# Patient Record
Sex: Male | Born: 1975 | Race: White | Hispanic: No | Smoking: Former smoker
Health system: Southern US, Community
[De-identification: ages and names within clinical notes are randomized; demographics above are authoritative.]

## PROBLEM LIST (undated history)

## (undated) DIAGNOSIS — K219 Gastro-esophageal reflux disease without esophagitis: Secondary | ICD-10-CM

## (undated) DIAGNOSIS — G4730 Sleep apnea, unspecified: Secondary | ICD-10-CM

## (undated) HISTORY — PX: OTHER SURGICAL HISTORY: SHX169

## (undated) HISTORY — DX: Gastro-esophageal reflux disease without esophagitis: K21.9

## (undated) HISTORY — PX: HERNIA REPAIR: SHX51

## (undated) HISTORY — PX: PENILE CYST REMOVAL: SHX2199

## (undated) HISTORY — DX: Sleep apnea, unspecified: G47.30

## (undated) HISTORY — PX: WISDOM TOOTH EXTRACTION: SHX21

---

## 2017-09-17 ENCOUNTER — Ambulatory Visit
Admission: RE | Admit: 2017-09-17 | Discharge: 2017-09-17 | Disposition: A | Discharge status: Home / Self Care | Payer: Self-pay | Source: Ambulatory Visit | Attending: Family Medicine | Admitting: Family Medicine

## 2017-09-17 ENCOUNTER — Other Ambulatory Visit: Payer: Self-pay | Admitting: Family Medicine

## 2017-09-17 DIAGNOSIS — N5089 Other specified disorders of the male genital organs: Secondary | ICD-10-CM

## 2017-09-18 ENCOUNTER — Other Ambulatory Visit: Payer: Self-pay

## 2019-05-11 IMAGING — US US SCROTUM W/ DOPPLER COMPLETE
1 series · 13 of 25 positions shown · non-contrast
Comparison: None.

CLINICAL DATA: 41-year-old male with right side palpable mass
discovered 2 weeks ago, slightly painful. No known injury. No prior
surgery.

EXAM:
SCROTAL ULTRASOUND
DOPPLER ULTRASOUND OF THE TESTICLES
TECHNIQUE: Complete ultrasound examination of the testicles, epididymis, and
other scrotal structures was performed. Color and spectral Doppler
ultrasound were also utilized to evaluate blood flow to the
testicles.

[Series 1: us scrotum w/ doppler complete · 0.08mm/px · 13 of 50 slices shown]
[im 1/50]
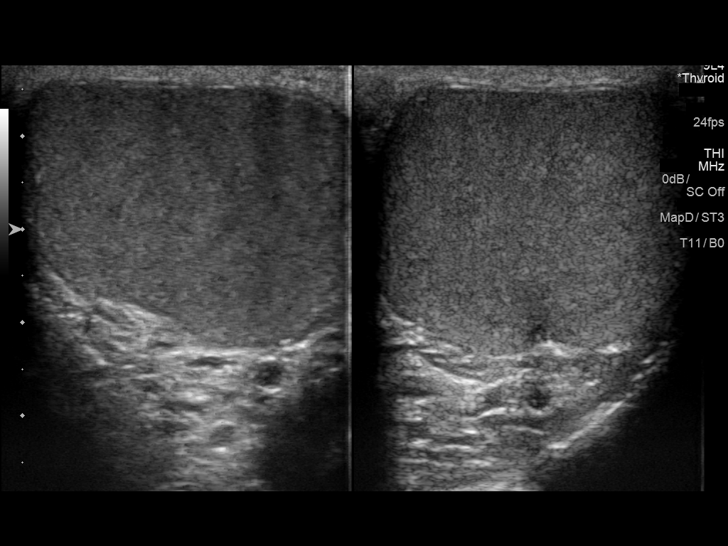
[im 5/50]
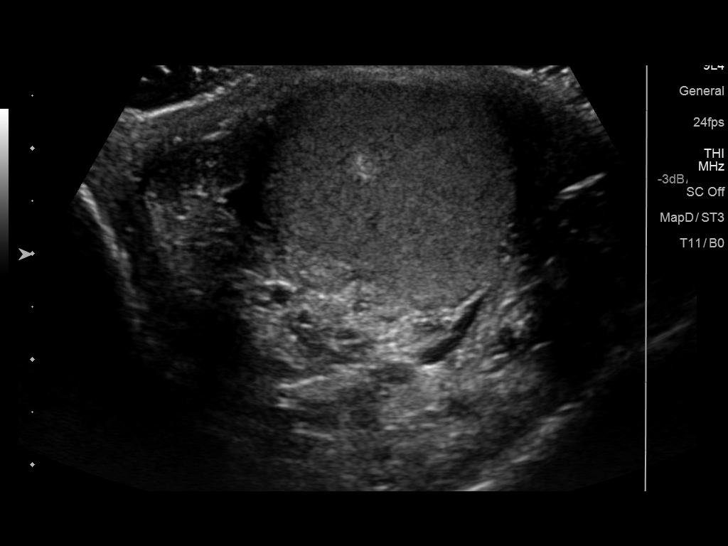
[im 9/50]
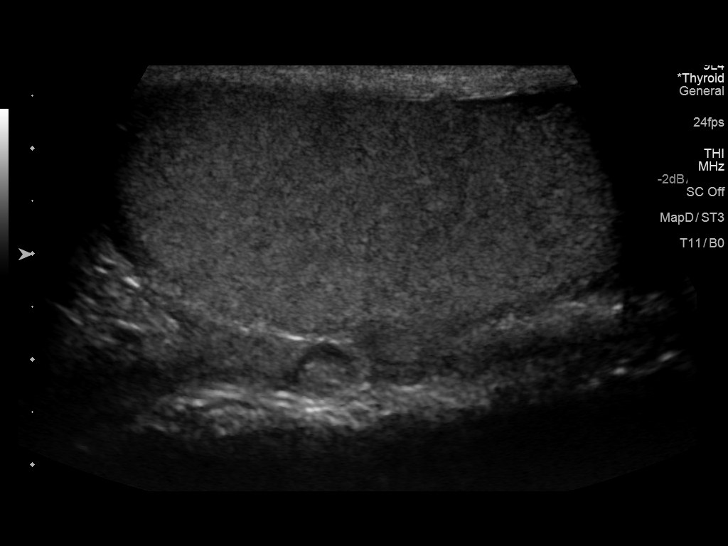
[im 13/50]
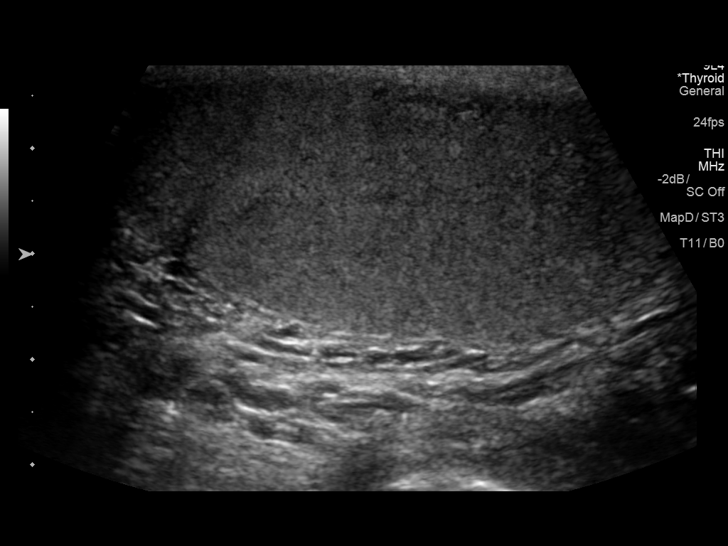
[im 17/50]
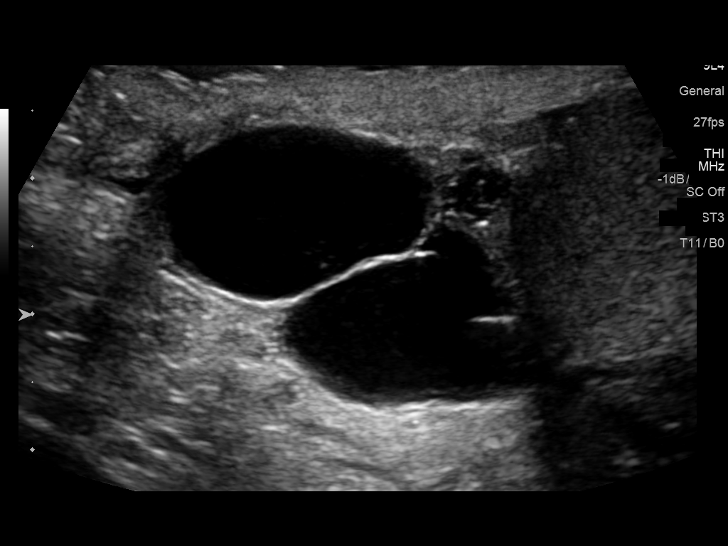
[im 21/50]
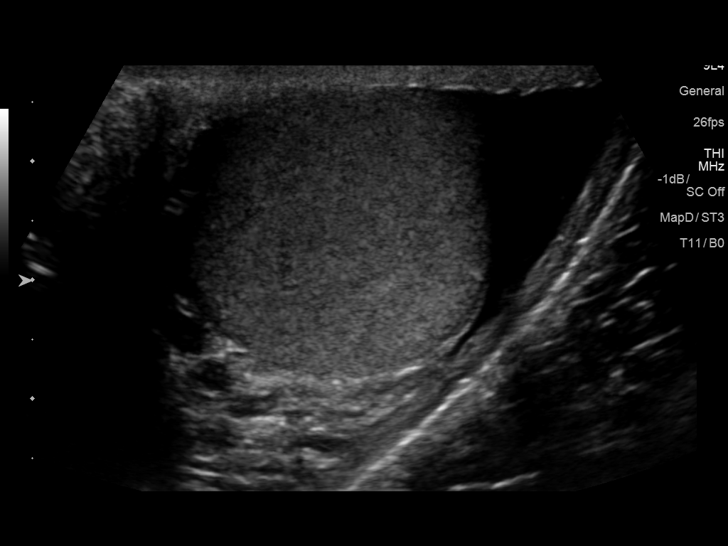
[im 25/50]
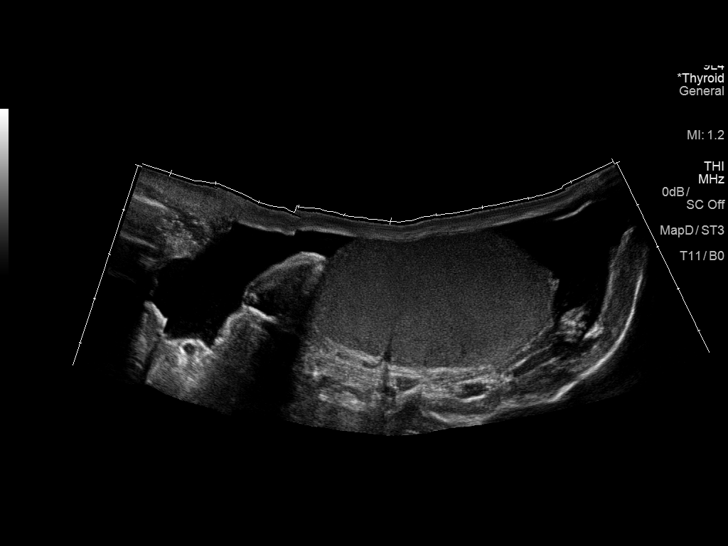
[im 29/50]
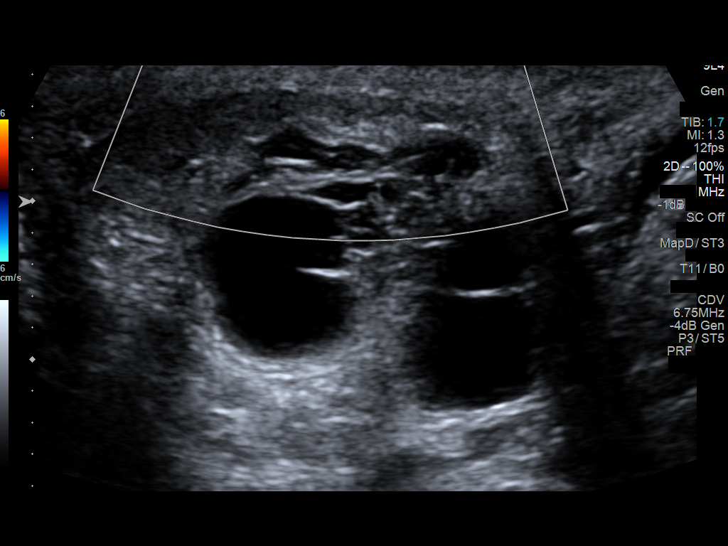
[im 33/50]
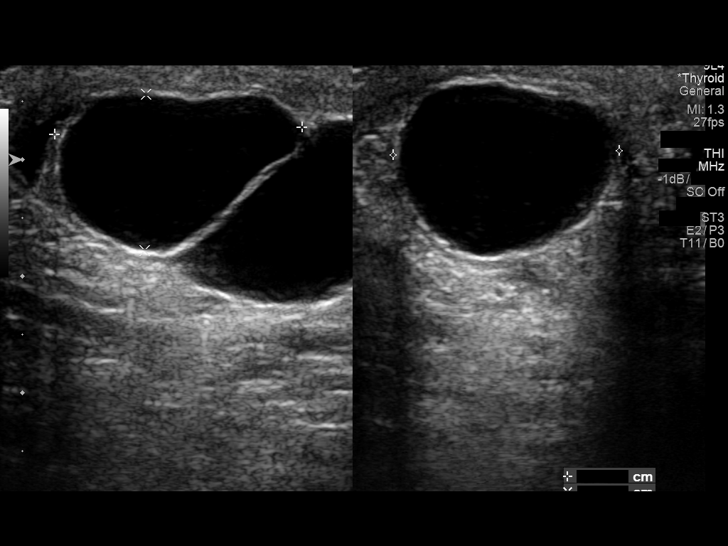
[im 37/50]
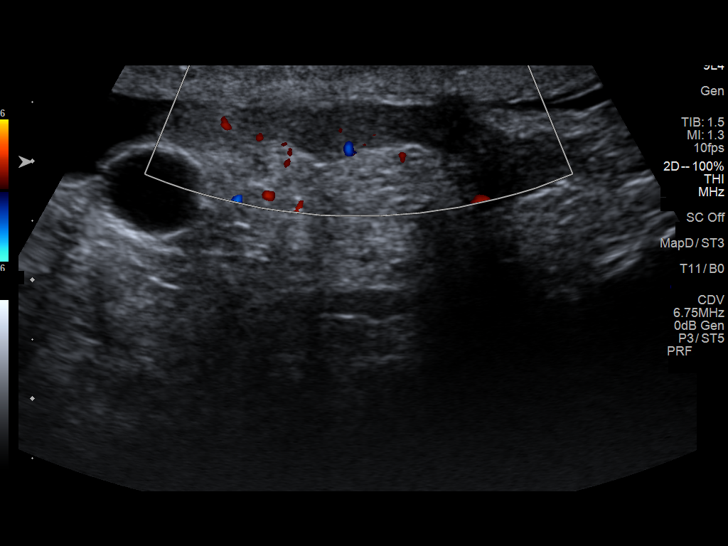
[im 41/50]
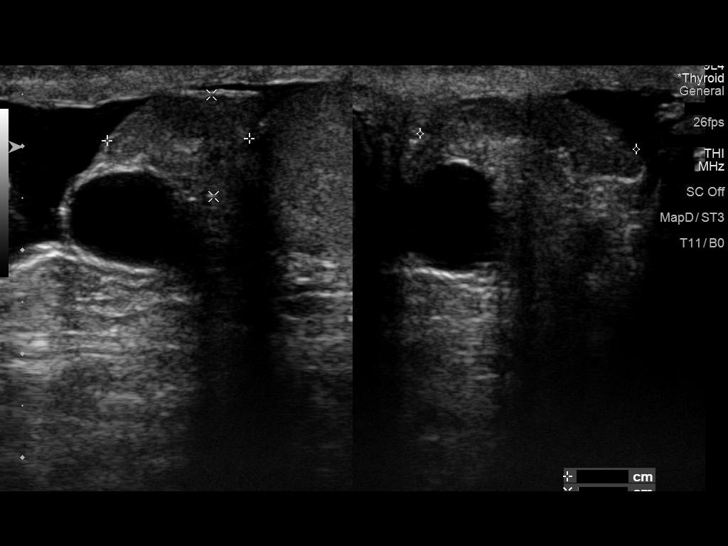
[im 45/50]
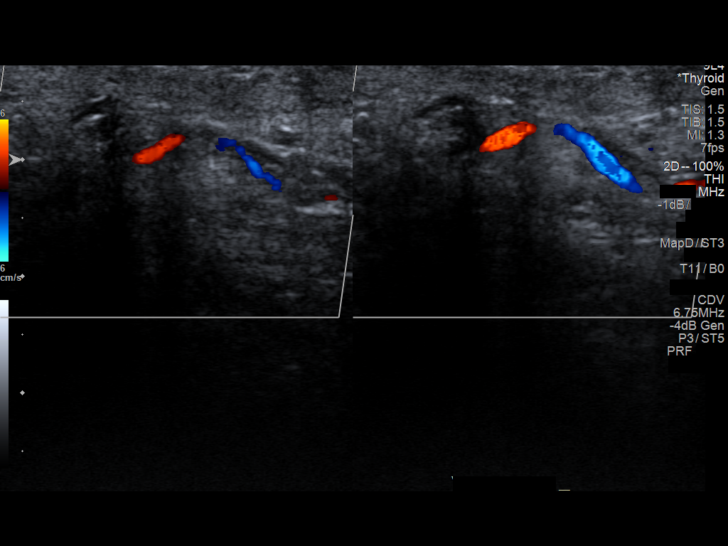
[im 50/50]
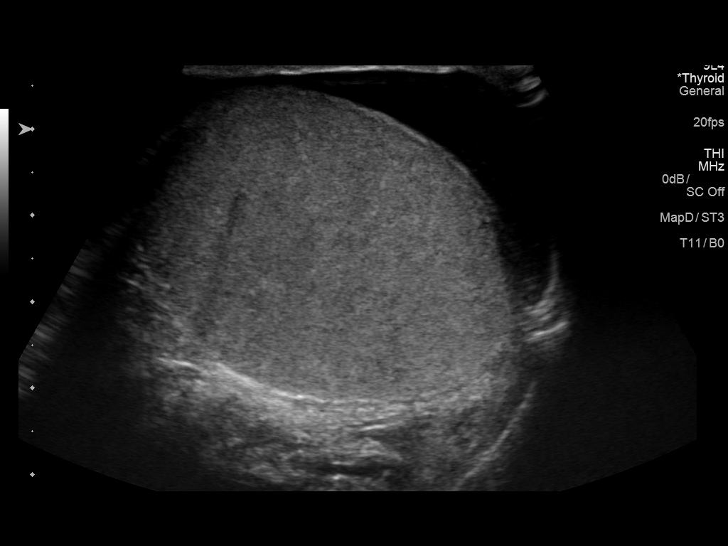

[13 of 25 positions shown; findings below may reference images not displayed]

FINDINGS: Right testicle

Measurements: 5.6 x 2.6 x 3.4 centimeters. No mass or microlithiasis
visualized.

Left testicle

Measurements: 5.1 x 3.0 x 3.4 centimeters. No mass or microlithiasis
visualized.

Right epididymis: Prominent but simple appearing cystic areas in the
head of the right epididymis measure up to 2.6 centimeters
individually with no associated vascularity (image 34) and appear to
correspond to the palpable abnormality (image 23). The right
epididymis otherwise appears normal.

Left epididymis: Normal aside from smaller 1.8 centimeter left
epididymal head cyst (image 46).

Hydrocele: Simple appearing left hydrocele (image 31). Negative for
right side hydrocele.

Varicocele:  None visualized.

Pulsed Doppler interrogation of both testes demonstrates normal low
resistance arterial and venous waveforms bilaterally.
IMPRESSION: 1. Negative for testicular mass or torsion. Benign appearing right
epididymal head cysts appear to correspond to the palpable
abnormality.
2. Small to moderate size left hydrocele with simple fluid
composition. This is probably postinflammatory.

## 2021-11-08 ENCOUNTER — Other Ambulatory Visit: Payer: Self-pay

## 2021-11-08 ENCOUNTER — Other Ambulatory Visit: Payer: Self-pay | Admitting: Dermatology

## 2021-11-08 ENCOUNTER — Ambulatory Visit (INDEPENDENT_AMBULATORY_CARE_PROVIDER_SITE_OTHER): Payer: No Typology Code available for payment source | Admitting: Dermatology

## 2021-11-08 DIAGNOSIS — L820 Inflamed seborrheic keratosis: Secondary | ICD-10-CM | POA: Diagnosis not present

## 2021-11-08 DIAGNOSIS — L918 Other hypertrophic disorders of the skin: Secondary | ICD-10-CM | POA: Diagnosis not present

## 2021-11-08 DIAGNOSIS — D1801 Hemangioma of skin and subcutaneous tissue: Secondary | ICD-10-CM

## 2021-11-08 DIAGNOSIS — D229 Melanocytic nevi, unspecified: Secondary | ICD-10-CM | POA: Diagnosis not present

## 2021-11-08 DIAGNOSIS — B360 Pityriasis versicolor: Secondary | ICD-10-CM | POA: Diagnosis not present

## 2021-11-08 DIAGNOSIS — Z1283 Encounter for screening for malignant neoplasm of skin: Secondary | ICD-10-CM | POA: Diagnosis not present

## 2021-11-08 DIAGNOSIS — D485 Neoplasm of uncertain behavior of skin: Secondary | ICD-10-CM

## 2021-11-08 LAB — POCT SKIN KOH: Skin KOH, POC: POSITIVE — AB

## 2021-11-08 MED ORDER — FLUCONAZOLE 100 MG PO TABS: 100.0000 mg | ORAL_TABLET | Freq: Every day | ORAL | 0 refills | Status: AC

## 2021-11-08 NOTE — Patient Instructions (Addendum)
Ketoconazole 1% shampoo - Nizoral ? ?Biopsy, Surgery (Curettage) & Surgery (Excision) Aftercare Instructions ? ?1. Okay to remove bandage in 24 hours ? ?2. Wash area with soap and water ? ?3. Apply Vaseline to area twice daily until healed (Not Neosporin) ? ?4. Okay to cover with a Band-Aid to decrease the chance of infection or prevent irritation from clothing; also it's okay to uncover lesion at home. ? ?5. Suture instructions: return to our office in 7-10 or 10-14 days for a nurse visit for suture removal. Variable healing with sutures, if pain or itching occurs call our office. It's okay to shower or bathe 24 hours after sutures are given. ? ?6. The following risks may occur after a biopsy, curettage or excision: bleeding, scarring, discoloration, recurrence, infection (redness, yellow drainage, pain or swelling). ? ?7. For questions, concerns and results call our office at [facility] before 4pm & Friday before 3pm. Biopsy results will be available in 1 week. ? ?

## 2021-11-21 ENCOUNTER — Encounter: Payer: Self-pay | Admitting: Dermatology

## 2021-11-21 NOTE — Progress Notes (Signed)
? ?  Follow-Up Visit ?  ?Subjective  ?Leonard White is a 46 y.o. male who presents for the following: Annual Exam (Pt her for annual. Pt has spots of concern on the inner elbow and neck, red spots on the flanks. No personal of family hx of melanoma or non melanoma skin cancer to pts knowledge ). ? ?General skin examination, rash on torso, changing growth on the lower abdomen ?Location:  ?Duration:  ?Quality:  ?Associated Signs/Symptoms: ?Modifying Factors:  ?Severity:  ?Timing: ?Context:  ? ?Objective  ?Well appearing patient in no apparent distress; mood and affect are within normal limits. ?No atypical pigmented lesions (all checked with dermoscopy).  1 possible nonmelanoma skin cancer lower abdomen will be biopsied. ? ?Mid Back, Right Abdomen (side) - Upper ?Multiple 1 mm smooth red dermal papules ? ?Left Lower Back ?4 mm, no dermoscopic atypia ? ?Right Axilla ?Pedunculated 2 mm flesh-colored papule ? ?Right Abdomen (side) - Lower ?1.2 cm keratotic nodule, the right portion fits a benign seborrheic keratosis but there is a waxy pink area on the left portion which could be an irritated Keratosis or a coincident squamous cell carcinoma. ? ? ? ? ? ? ?Left Upper Back, Mid Back, Right Upper Back ?Extensive subtle pink branny scale, KOH positive ? ? ? ?A full examination was performed including scalp, head, eyes, ears, nose, lips, neck, chest, axillae, abdomen, back, buttocks, bilateral upper extremities, bilateral lower extremities, hands, feet, fingers, toes, fingernails, and toenails. All findings within normal limits unless otherwise noted below.  Areas beneath undergarments not fully examined. ? ? ?Assessment & Plan  ? ? ?Encounter for screening for malignant neoplasm of skin ? ?Annual skin examination.  Continued ultraviolet protection. ? ?Cherry angioma (2) ?Right Abdomen (side) - Upper; Mid Back ? ?No intervention necessary ? ?Nevus ?Left Lower Back ? ?Leave if stable ? ?Skin tag ?Right Axilla ? ?Patient may  choose to have removed in future. ? ?Neoplasm of uncertain behavior of skin ?Right Abdomen (side) - Lower ? ?Skin / nail biopsy ?Type of biopsy: tangential   ?Informed consent: discussed and consent obtained   ?Timeout: patient name, date of birth, surgical site, and procedure verified   ?Anesthesia: the lesion was anesthetized in a standard fashion   ?Anesthetic:  1% lidocaine w/ epinephrine 1-100,000 local infiltration ?Instrument used: flexible razor blade   ?Hemostasis achieved with: aluminum chloride and electrodesiccation   ?Outcome: patient tolerated procedure well   ?Post-procedure details: wound care instructions given   ? ?Specimen 1 - Surgical pathology ?Differential Diagnosis: bcc vs scc - cautery only  ? ?Check Margins: No ? ?Tinea versicolor ?Left Upper Back; Right Upper Back; Mid Back ? ?Oral fluconazole, warned of rare possibility of idiosyncratic/allergic reaction involving skin or liver.  Also told not to judge blending of color for at least 3 to 6 months. ? ?POCT Skin KOH - Left Upper Back, Mid Back, Right Upper Back ? ?Related Medications ?fluconazole (DIFLUCAN) 100 MG tablet ?Take 1 tablet (100 mg total) by mouth daily. ? ? ? ? ? ?I, Lavonna Monarch, MD, have reviewed all documentation for this visit.  The documentation on 11/21/21 for the exam, diagnosis, procedures, and orders are all accurate and complete. ?

## 2021-12-08 ENCOUNTER — Encounter (HOSPITAL_BASED_OUTPATIENT_CLINIC_OR_DEPARTMENT_OTHER): Payer: Self-pay

## 2021-12-08 DIAGNOSIS — R400 Somnolence: Secondary | ICD-10-CM

## 2021-12-09 ENCOUNTER — Ambulatory Visit (HOSPITAL_BASED_OUTPATIENT_CLINIC_OR_DEPARTMENT_OTHER): Payer: No Typology Code available for payment source | Attending: Physician Assistant | Admitting: Internal Medicine

## 2021-12-09 VITALS — Ht 73.0 in | Wt 205.0 lb

## 2021-12-09 DIAGNOSIS — G4733 Obstructive sleep apnea (adult) (pediatric): Secondary | ICD-10-CM | POA: Insufficient documentation

## 2021-12-09 DIAGNOSIS — R400 Somnolence: Secondary | ICD-10-CM | POA: Insufficient documentation

## 2021-12-17 DIAGNOSIS — R400 Somnolence: Secondary | ICD-10-CM | POA: Diagnosis not present

## 2021-12-17 NOTE — Procedures (Signed)
? ? ? ?  Patient Name: Leonard, White ?Study Date: 12/09/2021 ?Gender: Male ?D.O.B: June 12, 1976 ?Age (years): 27 ?Referring Provider: Ramond Dial PA ?Height (inches): 72 ?Interpreting Physician: Baird Lyons MD, ABSM ?Weight (lbs): 205 ?RPSGT: Jacolyn Reedy ?BMI: 28 ?MRN: [id number] ?Neck Size: 17.50 ? ?CLINICAL INFORMATION ?Sleep Study Type: HST ?Indication for sleep study: OSA ?Epworth Sleepiness Score: 2 ? ?SLEEP STUDY TECHNIQUE ?A multi-channel overnight portable sleep study was performed. The channels recorded were: nasal airflow, thoracic respiratory movement, and oxygen saturation with a pulse oximetry. Snoring was also monitored. ? ?MEDICATIONS ?Patient self administered medications include: none reported. ? ?SLEEP ARCHITECTURE ?Patient was studied for 371.5 minutes. The sleep efficiency was 100.0 % and the patient was supine for 45.8%. The arousal index was 0.0 per hour. ? ?RESPIRATORY PARAMETERS ?The overall AHI was 37.0 per hour, with a central apnea index of 0 per hour. ?The oxygen nadir was 85% during sleep. ? ?CARDIAC DATA ?Mean heart rate during sleep was 61.8 bpm. ? ?IMPRESSIONS ?- Severe obstructive sleep apnea occurred during this study (AHI = 37.0/h). ?- Moderate oxygen desaturation was noted during this study (Min O2 = 85%). Mean 94% ?- Patient snored. ? ?DIAGNOSIS ?- Obstructive Sleep Apnea (G47.33) ? ?RECOMMENDATIONS ?- Suggest CPAP titration sleep study or autopap. Other options would be based on clinical judgment. ?- Be careful with alcohol, sedatives and other CNS depressants that may worsen sleep apnea and disrupt normal sleep architecture. ?- Sleep hygiene should be reviewed to assess factors that may improve sleep quality. ?- Weight management and regular exercise should be initiated or continued. ? ?[Electronically signed] 12/17/2021 11:56 AM ? ?Baird Lyons MD, ABSM ?Diplomate, Tax adviser of Sleep Medicine ?NPI: [id number] ?   ? ? ? ? ? ? ? ? ? ? ? ? ? ? ? ? ? ? ? ? ? ?Leonard White ?Diplomate, Tax adviser of Sleep Medicine ? ?ELECTRONICALLY SIGNED ON:  12/17/2021, 11:53 AM ?[place] ?PH: [phone]   FX: [phone] ?ACCREDITED BY THE [employer] ?

## 2022-11-20 ENCOUNTER — Other Ambulatory Visit (HOSPITAL_BASED_OUTPATIENT_CLINIC_OR_DEPARTMENT_OTHER): Payer: Self-pay

## 2022-11-20 DIAGNOSIS — G4733 Obstructive sleep apnea (adult) (pediatric): Secondary | ICD-10-CM

## 2022-11-23 ENCOUNTER — Encounter: Payer: Self-pay | Admitting: Gastroenterology

## 2022-12-11 ENCOUNTER — Ambulatory Visit (AMBULATORY_SURGERY_CENTER): Payer: Self-pay | Admitting: *Deleted

## 2022-12-11 VITALS — Ht 72.0 in | Wt 205.0 lb

## 2022-12-11 DIAGNOSIS — Z1211 Encounter for screening for malignant neoplasm of colon: Secondary | ICD-10-CM

## 2022-12-11 MED ORDER — NA SULFATE-K SULFATE-MG SULF 17.5-3.13-1.6 GM/177ML PO SOLN
1.0000 | Freq: Once | ORAL | 0 refills | Status: AC
Start: 2022-12-11 — End: 2022-12-11

## 2022-12-11 NOTE — Progress Notes (Signed)
Pt's name and DOB verified at the beginning of the pre-visit.  Pt denies any difficulty with ambulating.  No egg or soy allergy known to patient  No issues known to pt with past sedation with any surgeries or procedures Pt denies having issues being intubated Patient denies ever being intubated Pt has no issues moving head neck or swallowing No FH of Malignant Hyperthermia Pt is not on diet pills Pt is not on home 02  Pt is not on blood thinners  Pt denies issues with constipation  Pt is not on dialysis Pt denies any upcoming cardiac testing Pt encouraged to use to use Singlecare or Goodrx to reduce cost  Patient's chart reviewed by Cathlyn Parsons CNRA prior to pre-visit and patient appropriate for the [facility].  Pre-visit completed and red dot placed by patient's name on their procedure day (on provider's schedule).  . Visit by phone Pt states weight is 205 lb Instructed pt why it is important to and  to call if they have any changes in health or new medications. Directed them to the # given and on instructions.   Pt states they will.  Instructions reviewed with pt and pt states understanding. Instructed to review again prior to procedure. Pt states they will.  Instructions sent by mail with coupon and by my chart   Instructed not to use Marijuana day of and day before procedure.

## 2023-01-01 ENCOUNTER — Encounter: Payer: No Typology Code available for payment source | Admitting: Gastroenterology

## 2023-01-10 ENCOUNTER — Encounter: Payer: Self-pay | Admitting: Gastroenterology

## 2023-01-15 ENCOUNTER — Encounter: Payer: Self-pay | Admitting: Certified Registered Nurse Anesthetist

## 2023-01-19 ENCOUNTER — Telehealth: Payer: Self-pay | Admitting: Gastroenterology

## 2023-01-19 NOTE — Telephone Encounter (Signed)
Inbound call from patient rescheduling his procedure from 6/3 to 7/22 due to his insurance .

## 2023-01-22 ENCOUNTER — Encounter: Payer: No Typology Code available for payment source | Admitting: Gastroenterology

## 2023-03-12 ENCOUNTER — Encounter: Payer: No Typology Code available for payment source | Admitting: Gastroenterology
